# Patient Record
Sex: Male | Born: 1990 | Hispanic: No | Smoking: Never smoker
Health system: Southern US, Community
[De-identification: ages and names within clinical notes are randomized; demographics above are authoritative.]

---

## 2019-08-04 ENCOUNTER — Emergency Department (HOSPITAL_COMMUNITY)
Admission: EM | Admit: 2019-08-04 | Discharge: 2019-08-05 | Disposition: A | Discharge status: Home / Self Care | Payer: Self-pay | Attending: Emergency Medicine | Admitting: Emergency Medicine

## 2019-08-04 ENCOUNTER — Encounter (HOSPITAL_COMMUNITY): Payer: Self-pay | Admitting: Emergency Medicine

## 2019-08-04 ENCOUNTER — Emergency Department (HOSPITAL_COMMUNITY): Payer: Self-pay

## 2019-08-04 ENCOUNTER — Other Ambulatory Visit: Payer: Self-pay

## 2019-08-04 DIAGNOSIS — S8992XA Unspecified injury of left lower leg, initial encounter: Secondary | ICD-10-CM | POA: Insufficient documentation

## 2019-08-04 DIAGNOSIS — S8990XA Unspecified injury of unspecified lower leg, initial encounter: Secondary | ICD-10-CM

## 2019-08-04 DIAGNOSIS — Y9259 Other trade areas as the place of occurrence of the external cause: Secondary | ICD-10-CM | POA: Insufficient documentation

## 2019-08-04 DIAGNOSIS — Y990 Civilian activity done for income or pay: Secondary | ICD-10-CM | POA: Insufficient documentation

## 2019-08-04 DIAGNOSIS — X58XXXA Exposure to other specified factors, initial encounter: Secondary | ICD-10-CM | POA: Insufficient documentation

## 2019-08-04 DIAGNOSIS — Y9301 Activity, walking, marching and hiking: Secondary | ICD-10-CM | POA: Insufficient documentation

## 2019-08-04 MED ORDER — KETOROLAC TROMETHAMINE 60 MG/2ML IM SOLN
60.0000 mg | Freq: Once | INTRAMUSCULAR | Status: AC
  Administered 2019-08-05: 60 mg via INTRAMUSCULAR
  Filled 2019-08-04: qty 2

## 2019-08-04 MED ORDER — DICLOFENAC SODIUM ER 100 MG PO TB24: 100.0000 mg | ORAL_TABLET | Freq: Every day | ORAL | 0 refills | Status: AC

## 2019-08-04 NOTE — ED Notes (Addendum)
Pt ambulated to the restroom. Pt favoring left leg, not putting much pressure on it.

## 2019-08-04 NOTE — ED Triage Notes (Signed)
Patient complaining of left lower leg (calf). Patient states it starting hurting today. He states he does not know what he did. He can not put pressure on his leg. He states it hurts to walk.

## 2019-08-05 ENCOUNTER — Encounter (HOSPITAL_COMMUNITY): Payer: Self-pay | Admitting: Emergency Medicine

## 2019-08-05 NOTE — ED Provider Notes (Signed)
[facility]-EMERGENCY DEPT Provider Note   CSN: [id number] Arrival date & time: 08/04/19  2058     History   Chief Complaint Chief Complaint  Patient presents with  . Leg Pain    HPI Angel Gallagher is a 28 y.o. male.     The history is provided by the patient.  Leg Pain Location:  Leg Time since incident:  2 hours Lower extremity injury: walking and moving at work.   Leg location:  L lower leg Pain details:    Quality:  Aching   Radiates to:  Does not radiate   Severity:  Severe   Onset quality:  Sudden   Duration:  2 hours   Timing:  Constant   Progression:  Unchanged Chronicity:  New Dislocation: no   Foreign body present:  No foreign bodies Prior injury to area:  No Relieved by:  Nothing Worsened by:  Nothing Ineffective treatments:  None tried Associated symptoms: swelling   Associated symptoms: no back pain, no decreased ROM, no fatigue, no fever, no itching, no muscle weakness, no neck pain, no numbness, no stiffness and no tingling   Risk factors: no obesity   Patient states injured left calf at work.  Hurts to put pressure on it, is able to flex and extend the knee and fully range the left ankle joint.  No long care trips or plane trips.  No immobilization.  No f/c/r. No wounds.    History reviewed. No pertinent past medical history.  There are no active problems to display for this patient.   History reviewed. No pertinent surgical history.      Home Medications    Prior to Admission medications   Medication Sig Start Date End Date Taking? Authorizing Provider  Diclofenac Sodium CR 100 MG 24 hr tablet Take 1 tablet (100 mg total) by mouth daily. 08/04/19   Winson Eichorn, MD    Family History History reviewed. No pertinent family history.  Social History Social History   Tobacco Use  . Smoking status: Never Smoker  . Smokeless tobacco: Current User    Types: Chew  Substance Use Topics  . Alcohol use: Not Currently  .  Drug use: Not Currently     Allergies   Patient has no known allergies.   Review of Systems Review of Systems  Constitutional: Negative for fatigue and fever.  HENT: Negative for congestion.   Eyes: Negative for visual disturbance.  Respiratory: Negative for cough and shortness of breath.   Cardiovascular: Negative for chest pain and palpitations.  Genitourinary: Negative for difficulty urinating.  Musculoskeletal: Positive for myalgias. Negative for back pain, neck pain and stiffness.  Skin: Negative for color change and itching.  Neurological: Negative for weakness.  All other systems reviewed and are negative.    Physical Exam Updated Vital Signs BP (!) 139/91 (BP Location: Right Arm)   Pulse 70   Temp 98.1 F (36.7 C) (Oral)   Resp 18   Ht 6\' 1"  (1.854 m)   Wt 99.8 kg   SpO2 99%   BMI 29.03 kg/m   Physical Exam Vitals signs and nursing note reviewed.  Constitutional:      General: He is not in acute distress.    Appearance: Normal appearance.  HENT:     Head: Normocephalic and atraumatic.     Nose: Nose normal.  Eyes:     Conjunctiva/sclera: Conjunctivae normal.     Pupils: Pupils are equal, round, and reactive to light.  Neck:  Musculoskeletal: Normal range of motion and neck supple.  Cardiovascular:     Rate and Rhythm: Normal rate and regular rhythm.     Pulses: Normal pulses.     Heart sounds: Normal heart sounds.  Pulmonary:     Effort: Pulmonary effort is normal.     Breath sounds: Normal breath sounds.  Abdominal:     General: Abdomen is flat. Bowel sounds are normal.     Tenderness: There is no abdominal tenderness. There is no guarding.  Musculoskeletal: Normal range of motion.     Left knee: Normal.     Left ankle: Normal. Achilles tendon normal. Achilles tendon exhibits no pain and no defect.     Left lower leg: He exhibits no bony tenderness, no swelling, no deformity and no laceration. No edema.       Legs:     Left foot: Normal.   Neurological:     Mental Status: He is alert.      ED Treatments / Results  Labs (all labs ordered are listed, but only abnormal results are displayed) Labs Reviewed - No data to display  EKG None  Radiology Dg Tibia/fibula Left  Result Date: 08/04/2019 CLINICAL DATA:  Left lower leg pain.  No known injury. EXAM: LEFT TIBIA AND FIBULA - 2 VIEW COMPARISON:  None. FINDINGS: There is no evidence of fracture or other focal bone lesions. Soft tissues are unremarkable. IMPRESSION: Negative. Electronically Signed   By: Rolm Baptise M.D.   On: 08/04/2019 23:40    Procedures Procedures (including critical care time)  Medications Ordered in ED Medications  ketorolac (TORADOL) injection 60 mg (60 mg Intramuscular Given 08/05/19 0020)     Initial Impression / Assessment and Plan / ED Course  Case d/w radiology, CT  Will not show a partial tear in the gastrocnemius.    Given full ROM it is not a complete tear.  There is no defect.  Will treat as a partial tear of the gastrocnemius. Will place in cam walker and have provided crutches.  Have referred to orthopedics and sports medicine.  RICE therapy.  Wear cam walker at all times until evaluated by orthopedics.  No sports.    Angel Gallagher was evaluated in Emergency Department on 08/05/2019 for the symptoms described in the history of present illness. He was evaluated in the context of the global COVID-19 pandemic, which necessitated consideration that the patient might be at risk for infection with the SARS-CoV-2 virus that causes COVID-19. Institutional protocols and algorithms that pertain to the evaluation of patients at risk for COVID-19 are in a state of rapid change based on information released by regulatory bodies including the CDC and federal and state organizations. These policies and algorithms were followed during the patient's care in the ED.   Final Clinical Impressions(s) / ED Diagnoses   Final diagnoses:  Injury of calf    Return for weakness, numbness, changes in vision or speech, fevers >100.4 unrelieved by medication, shortness of breath, intractable vomiting, or diarrhea, abdominal pain, Inability to tolerate liquids or food, cough, altered mental status or any concerns. No signs of systemic illness or infection. The patient is nontoxic-appearing on exam and vital signs are within normal limits.   I have reviewed the triage vital signs and the nursing notes. Pertinent labs &imaging results that were available during my care of the patient were reviewed by me and considered in my medical decision making (see chart for details).  After history, exam, and medical workup  I feel the patient has been appropriately medically screened and is safe for discharge home. Pertinent diagnoses were discussed with the patient. Patient was given return precautions  ED Discharge Orders         Ordered    Diclofenac Sodium CR 100 MG 24 hr tablet  Daily     08/04/19 2358           Larna Capelle, MD 08/05/19 [id number]

## 2020-11-28 IMAGING — CR DG TIBIA/FIBULA 2V*L*
4 series · 4 of 4 positions shown · non-contrast
Comparison: None.

CLINICAL DATA: Left lower leg pain.  No known injury.

EXAM:
LEFT TIBIA AND FIBULA - 2 VIEW

[x tib-fib ap left (1 of 2)]
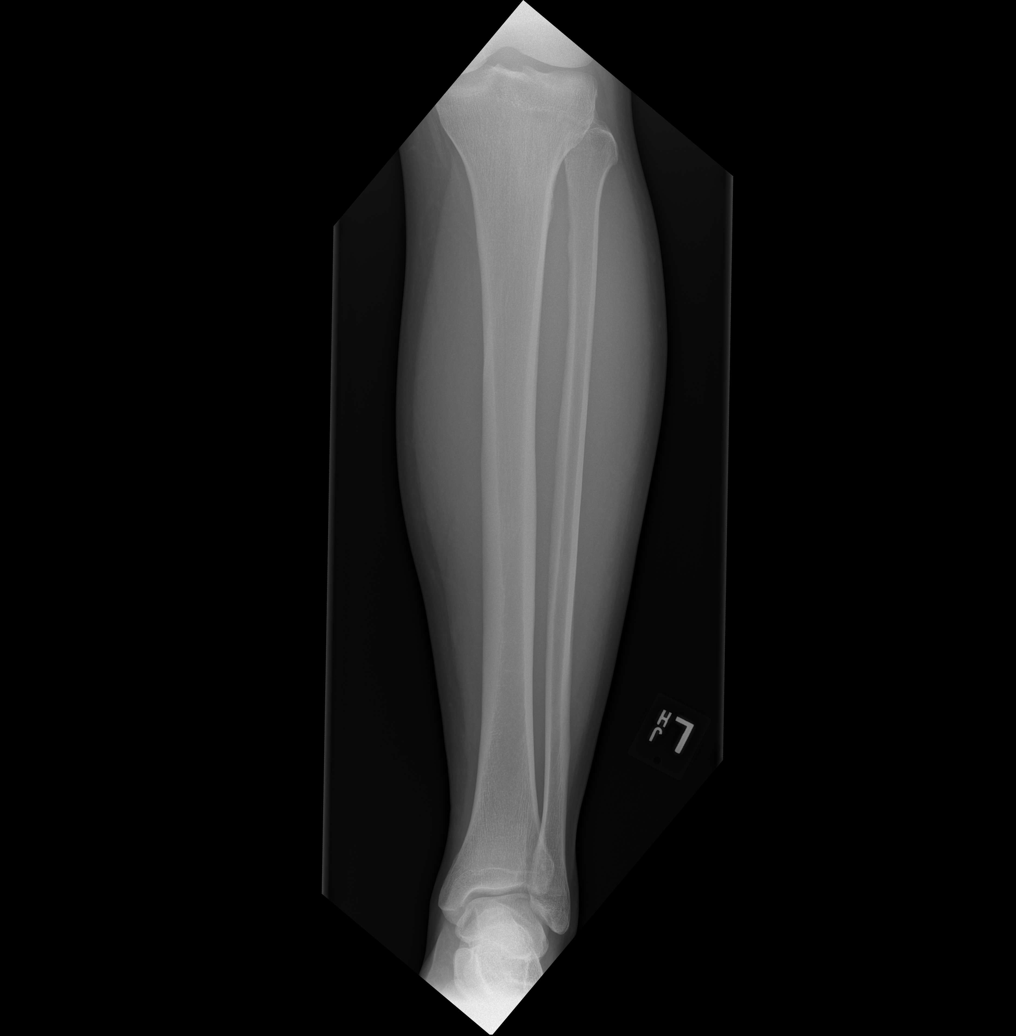

[x tib-fib ap left (2 of 2)]
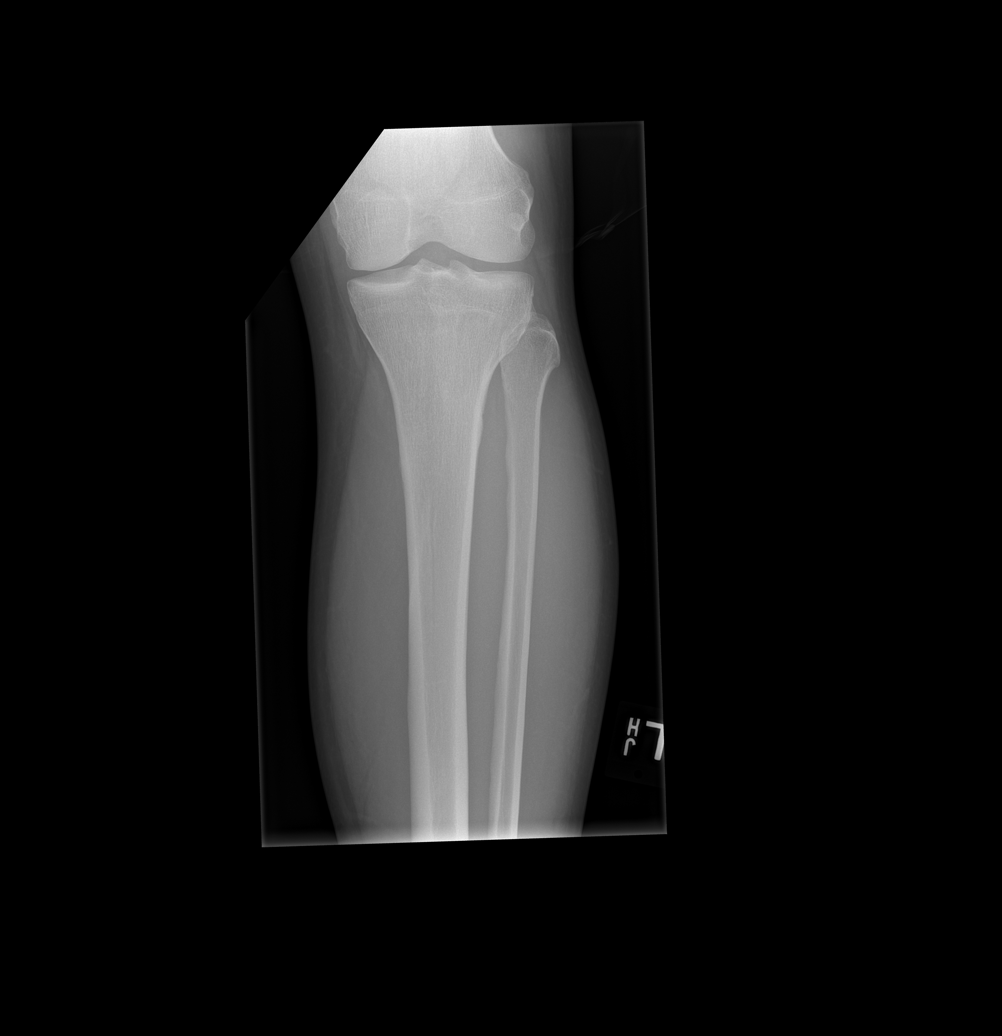

[x tib-fib lat left (1 of 2)]
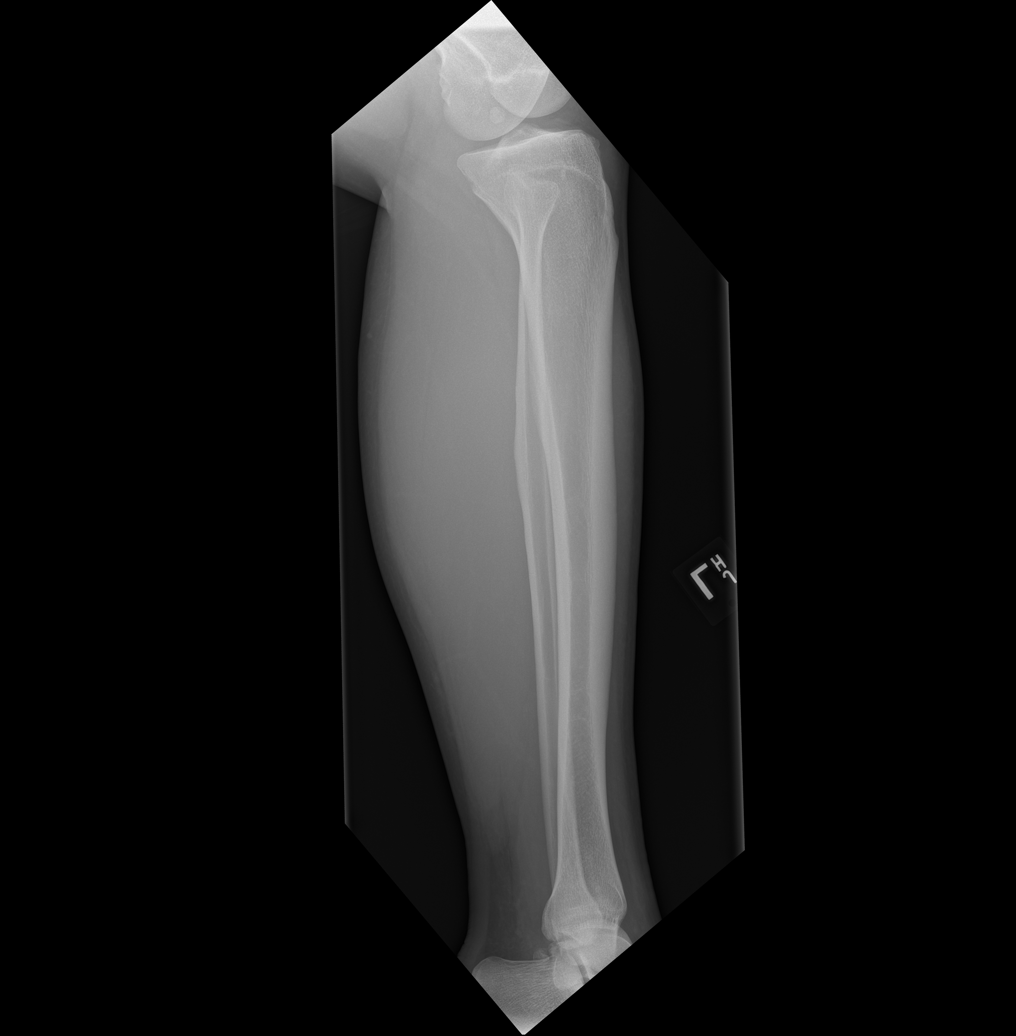

[x tib-fib lat left (2 of 2)]
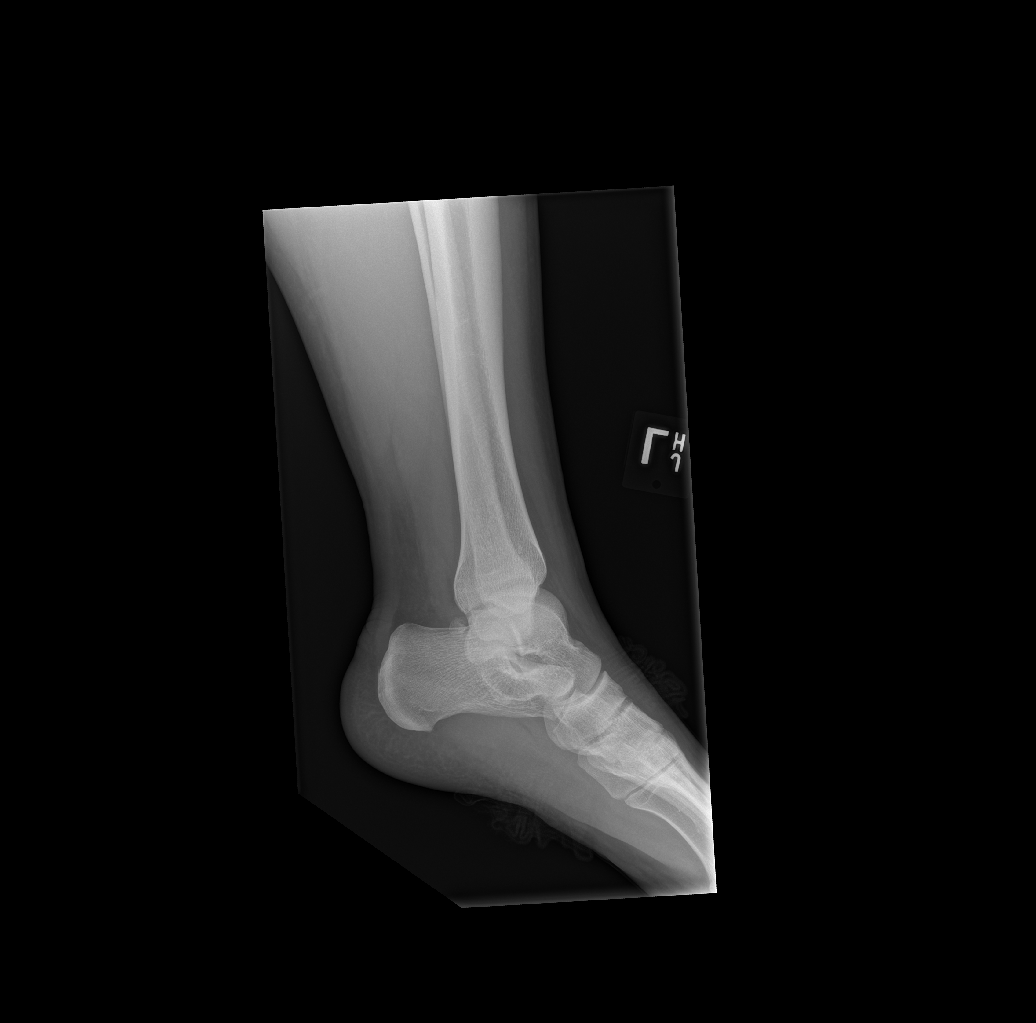

[4 of 4 positions shown; findings below may reference images not displayed]

FINDINGS: There is no evidence of fracture or other focal bone lesions. Soft
tissues are unremarkable.
IMPRESSION: Negative.
